# Patient Record
Sex: Male | Born: 1980 | Hispanic: Yes | Marital: Married | Smoking: Never smoker
Health system: Southern US, Community
[De-identification: ages and names within clinical notes are randomized; demographics above are authoritative.]

---

## 2004-09-25 ENCOUNTER — Emergency Department (HOSPITAL_COMMUNITY): Admission: EM | Admit: 2004-09-25 | Discharge: 2004-09-25 | Payer: Self-pay | Admitting: Emergency Medicine

## 2004-09-28 ENCOUNTER — Emergency Department (HOSPITAL_COMMUNITY): Admission: EM | Admit: 2004-09-28 | Discharge: 2004-09-28 | Payer: Self-pay | Admitting: Emergency Medicine

## 2004-09-30 ENCOUNTER — Emergency Department (HOSPITAL_COMMUNITY): Admission: EM | Admit: 2004-09-30 | Discharge: 2004-09-30 | Payer: Self-pay | Admitting: Family Medicine

## 2004-10-14 ENCOUNTER — Emergency Department (HOSPITAL_COMMUNITY): Admission: EM | Admit: 2004-10-14 | Discharge: 2004-10-14 | Payer: Self-pay | Admitting: Emergency Medicine

## 2004-12-19 ENCOUNTER — Emergency Department (HOSPITAL_COMMUNITY): Admission: EM | Admit: 2004-12-19 | Discharge: 2004-12-20 | Payer: Self-pay | Admitting: Emergency Medicine

## 2010-04-25 ENCOUNTER — Emergency Department (HOSPITAL_COMMUNITY)
Admission: EM | Admit: 2010-04-25 | Discharge: 2010-04-25 | Payer: Self-pay | Source: Home / Self Care | Admitting: Emergency Medicine

## 2010-04-26 ENCOUNTER — Emergency Department (HOSPITAL_COMMUNITY)
Admission: EM | Admit: 2010-04-26 | Discharge: 2010-04-26 | Payer: Self-pay | Source: Home / Self Care | Admitting: Emergency Medicine
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
## 2019-01-31 ENCOUNTER — Other Ambulatory Visit: Payer: Self-pay

## 2019-01-31 ENCOUNTER — Emergency Department (HOSPITAL_COMMUNITY)
Admission: EM | Admit: 2019-01-31 | Discharge: 2019-02-01 | Disposition: A | Discharge status: Home / Self Care | Payer: Self-pay | Attending: Emergency Medicine | Admitting: Emergency Medicine

## 2019-01-31 ENCOUNTER — Encounter (HOSPITAL_COMMUNITY): Payer: Self-pay | Admitting: Emergency Medicine

## 2019-01-31 DIAGNOSIS — Z20822 Contact with and (suspected) exposure to covid-19: Secondary | ICD-10-CM

## 2019-01-31 DIAGNOSIS — R1013 Epigastric pain: Secondary | ICD-10-CM

## 2019-01-31 DIAGNOSIS — U071 COVID-19: Secondary | ICD-10-CM | POA: Insufficient documentation

## 2019-01-31 DIAGNOSIS — R7401 Elevation of levels of liver transaminase levels: Secondary | ICD-10-CM | POA: Insufficient documentation

## 2019-01-31 LAB — URINALYSIS, ROUTINE W REFLEX MICROSCOPIC
Bilirubin Urine: NEGATIVE
Glucose, UA: NEGATIVE mg/dL
Hgb urine dipstick: NEGATIVE
Ketones, ur: NEGATIVE mg/dL
Leukocytes,Ua: NEGATIVE
Nitrite: NEGATIVE
Protein, ur: NEGATIVE mg/dL
Specific Gravity, Urine: 1.018 (ref 1.005–1.030)
pH: 6 (ref 5.0–8.0)

## 2019-01-31 LAB — COMPREHENSIVE METABOLIC PANEL
ALT: 178 U/L — ABNORMAL HIGH (ref 0–44)
AST: 105 U/L — ABNORMAL HIGH (ref 15–41)
Albumin: 3.7 g/dL (ref 3.5–5.0)
Alkaline Phosphatase: 102 U/L (ref 38–126)
Anion gap: 10 (ref 5–15)
BUN: 11 mg/dL (ref 6–20)
CO2: 28 mmol/L (ref 22–32)
Calcium: 8.8 mg/dL — ABNORMAL LOW (ref 8.9–10.3)
Chloride: 102 mmol/L (ref 98–111)
Creatinine, Ser: 1.1 mg/dL (ref 0.61–1.24)
GFR calc Af Amer: >60 mL/min (ref >60)
GFR calc non Af Amer: >60 mL/min (ref >60)
Glucose, Bld: 92 mg/dL (ref 70–99)
Potassium: 3.9 mmol/L (ref 3.5–5.1)
Sodium: 140 mmol/L (ref 135–145)
Total Bilirubin: 0.9 mg/dL (ref 0.3–1.2)
Total Protein: 7.1 g/dL (ref 6.5–8.1)

## 2019-01-31 LAB — CBC
HCT: 43.8 % (ref 39.0–52.0)
Hemoglobin: 14.8 g/dL (ref 13.0–17.0)
MCH: 31.8 pg (ref 26.0–34.0)
MCHC: 33.8 g/dL (ref 30.0–36.0)
MCV: 94 fL (ref 80.0–100.0)
Platelets: 237 10*3/uL (ref 150–400)
RBC: 4.66 MIL/uL (ref 4.22–5.81)
RDW: 11.1 % — ABNORMAL LOW (ref 11.5–15.5)
WBC: 6 10*3/uL (ref 4.0–10.5)
nRBC: 0 % (ref 0.0–0.2)

## 2019-01-31 LAB — LIPASE, BLOOD: Lipase: 23 U/L (ref 11–51)

## 2019-01-31 MED ORDER — SODIUM CHLORIDE 0.9% FLUSH: 3.0000 mL | Freq: Once | INTRAVENOUS | Status: DC

## 2019-01-31 NOTE — ED Triage Notes (Signed)
Pt c/o Abd pain, nausea and diarrhea for the past 4 days, pt states he is no able to smell or taste for the past 4 day neither.

## 2019-02-01 ENCOUNTER — Other Ambulatory Visit: Payer: Self-pay

## 2019-02-01 ENCOUNTER — Emergency Department (HOSPITAL_COMMUNITY): Payer: Self-pay

## 2019-02-01 MED ORDER — PANTOPRAZOLE SODIUM 40 MG PO TBEC
40.0000 mg | DELAYED_RELEASE_TABLET | Freq: Once | ORAL | Status: AC
  Administered 2019-02-01: 40 mg via ORAL
  Filled 2019-02-01: qty 1

## 2019-02-01 MED ORDER — LIDOCAINE VISCOUS HCL 2 % MT SOLN
15.0000 mL | Freq: Once | OROMUCOSAL | Status: AC
  Administered 2019-02-01: 03:00:00 15 mL via ORAL
  Filled 2019-02-01: qty 15

## 2019-02-01 MED ORDER — METOCLOPRAMIDE HCL 10 MG PO TABS: 10.0000 mg | ORAL_TABLET | Freq: Four times a day (QID) | ORAL | 0 refills | Status: AC | PRN

## 2019-02-01 MED ORDER — ALUM & MAG HYDROXIDE-SIMETH 200-200-20 MG/5ML PO SUSP
30.0000 mL | Freq: Once | ORAL | Status: AC
  Administered 2019-02-01: 30 mL via ORAL
  Filled 2019-02-01: qty 30

## 2019-02-01 MED ORDER — ONDANSETRON 4 MG PO TBDP
8.0000 mg | ORAL_TABLET | Freq: Once | ORAL | Status: AC
  Administered 2019-02-01: 8 mg via ORAL
  Filled 2019-02-01: qty 2

## 2019-02-01 NOTE — ED Notes (Signed)
Pt states improved abdominal pain rating 3 out of 10

## 2019-02-01 NOTE — ED Provider Notes (Signed)
[facility] EMERGENCY DEPARTMENT Provider Note   CSN: [id number] Arrival date & time: 01/31/19  2039    History   Chief Complaint Chief Complaint  Patient presents with  . Abdominal Pain    HPI Kenneth Acosta is a 38 y.o. male.   The history is provided by the patient.  Abdominal Pain He has been sick for the last 2 weeks with subjective fever as well as chills and sweats.  There has been a cough which is productive of small amount of clear sputum.  He has had some shortness of breath.  Over the last week, he has developed epigastric pain with nausea but no vomiting.  He did have diarrhea starting about 5 days ago, but that has since resolved.  Epigastric pain is worse with eating.  Also, he has lost a sense of smell and taste over the last week.  He tried taking acetaminophen, ibuprofen without relief.  He also complains of generalized body aches.  He denies tobacco and ethanol use.  There has been no known exposure to COVID-19.  History reviewed. No pertinent past medical history.  There are no active problems to display for this patient.   History reviewed. No pertinent surgical history.      Home Medications    Prior to Admission medications   Not on File    Family History History reviewed. No pertinent family history.  Social History Social History   Tobacco Use  . Smoking status: Never Smoker  Substance Use Topics  . Alcohol use: Never    Frequency: Never  . Drug use: Never     Allergies   Patient has no known allergies.   Review of Systems Review of Systems  Gastrointestinal: Positive for abdominal pain.  All other systems reviewed and are negative.    Physical Exam Updated Vital Signs BP 104/62 (BP Location: Left Arm)   Pulse 75   Temp 98.2 F (36.8 C) (Oral)   Resp 20   Ht 5\' 8"  (1.727 m)   Wt 99.8 kg   SpO2 99%   BMI 33.45 kg/m   Physical Exam Vitals signs and nursing note reviewed.    38 year old male,  resting comfortably and in no acute distress. Vital signs are significant for borderline elevated heart rate. Oxygen saturation is 97%, which is normal. Head is normocephalic and atraumatic. PERRLA, EOMI. Oropharynx is clear. Neck is nontender and supple without adenopathy or JVD. Back is nontender and there is no CVA tenderness. Lungs are clear without rales, wheezes, or rhonchi. Chest is nontender. Heart has regular rate and rhythm without murmur. Abdomen is soft, flat, with mild epigastric tenderness.  There is no rebound or guarding.  There are no masses or hepatosplenomegaly and peristalsis is normoactive. Extremities have no cyanosis or edema, full range of motion is present. Skin is warm and dry without rash. Neurologic: Mental status is normal, cranial nerves are intact, there are no motor or sensory deficits.  ED Treatments / Results  Labs (all labs ordered are listed, but only abnormal results are displayed) Labs Reviewed  COMPREHENSIVE METABOLIC PANEL - Abnormal; Notable for the following components:      Result Value   Calcium 8.8 (*)    AST 105 (*)    ALT 178 (*)    All other components within normal limits  CBC - Abnormal; Notable for the following components:   RDW 11.1 (*)    All other components within normal limits  NOVEL  CORONAVIRUS, NAA (HOSP ORDER, SEND-OUT TO REF LAB; TAT 18-24 HRS)  LIPASE, BLOOD  URINALYSIS, ROUTINE W REFLEX MICROSCOPIC   Radiology Dg Chest Port 1 View  Result Date: 02/01/2019 CLINICAL DATA:  Cough and fever EXAM: PORTABLE CHEST 1 VIEW COMPARISON:  None. FINDINGS: The heart size and mediastinal contours are within normal limits. Both lungs are clear. The visualized skeletal structures are unremarkable. IMPRESSION: Clear lungs Electronically Signed   By: Deatra Robinson M.D.   On: 02/01/2019 02:54    Procedures Procedures  Medications Ordered in ED Medications  sodium chloride flush (NS) 0.9 % injection 3 mL (3 mLs Intravenous Not Given  02/01/19 0155)  ondansetron (ZOFRAN-ODT) disintegrating tablet 8 mg (8 mg Oral Given 02/01/19 0234)  alum & mag hydroxide-simeth (MAALOX/MYLANTA) 200-200-20 MG/5ML suspension 30 mL (30 mLs Oral Given 02/01/19 0234)    And  lidocaine (XYLOCAINE) 2 % viscous mouth solution 15 mL (15 mLs Oral Given 02/01/19 0234)  pantoprazole (PROTONIX) EC tablet 40 mg (40 mg Oral Given 02/01/19 0234)     Initial Impression / Assessment and Plan / ED Course  I have reviewed the triage vital signs and the nursing notes.  Pertinent labs & imaging results that were available during my care of the patient were reviewed by me and considered in my medical decision making (see chart for details).  Symptom complex worrisome for COVID-19.  Epigastric pain may be gastritis, peptic ulcer, GERD.  No red flags to suggest more serious pathology.  Screening labs are significant for mild elevation of transaminases which is nonspecific, but consistent with COVID-19.  Will check chest x-ray and will give therapeutic trial of ondansetron, GI cocktail, pantoprazole.  Old records are reviewed, and there are no relevant past visits.  Will send nasal swab for COVID-19.  Chest x-ray is unremarkable.  He had excellent relief of symptoms with above-noted treatment.  He is discharged with prescription for metoclopramide for nausea, advised use over-the-counter omeprazole and antacids as needed.  Return precautions discussed.  Kenneth Acosta was evaluated in Emergency Department on 02/01/2019 for the symptoms described in the history of present illness. He was evaluated in the context of the global COVID-19 pandemic, which necessitated consideration that the patient might be at risk for infection with the SARS-CoV-2 virus that causes COVID-19. Institutional protocols and algorithms that pertain to the evaluation of patients at risk for COVID-19 are in a state of rapid change based on information released by regulatory bodies including the CDC  and federal and state organizations. These policies and algorithms were followed during the patient's care in the ED.  Final Clinical Impressions(s) / ED Diagnoses   Final diagnoses:  Epigastric pain  Suspected COVID-19 virus infection  Elevated transaminase level    ED Discharge Orders         Ordered    metoCLOPramide (REGLAN) 10 MG tablet  Every 6 hours PRN     02/01/19 0317           Dione Booze, MD 02/01/19 0320

## 2019-02-01 NOTE — ED Notes (Signed)
Discharge instructions including medications and covid precautions/results discussed with pt. Pt verbalized understanding with no questions at this time

## 2019-02-01 NOTE — ED Notes (Signed)
X-ray at bedside

## 2019-02-01 NOTE — Discharge Instructions (Addendum)
Take omeprazole (Prilosec OTC) once a day.  Take antacids like Pepto Bismol, Maalox, Tums as needed.  Your Covid 19 test will be available in about two days. You should be able to see it on My Chart.  Return if you start getting short of breath, or have any other concerns.

## 2019-02-04 LAB — NOVEL CORONAVIRUS, NAA (HOSP ORDER, SEND-OUT TO REF LAB; TAT 18-24 HRS): SARS-CoV-2, NAA: DETECTED — AB

## 2021-04-24 IMAGING — DX DG CHEST 1V PORT
1 series · 1 of 1 positions shown · non-contrast
Comparison: None.

CLINICAL DATA: Cough and fever

EXAM:
PORTABLE CHEST 1 VIEW

[chest ap]
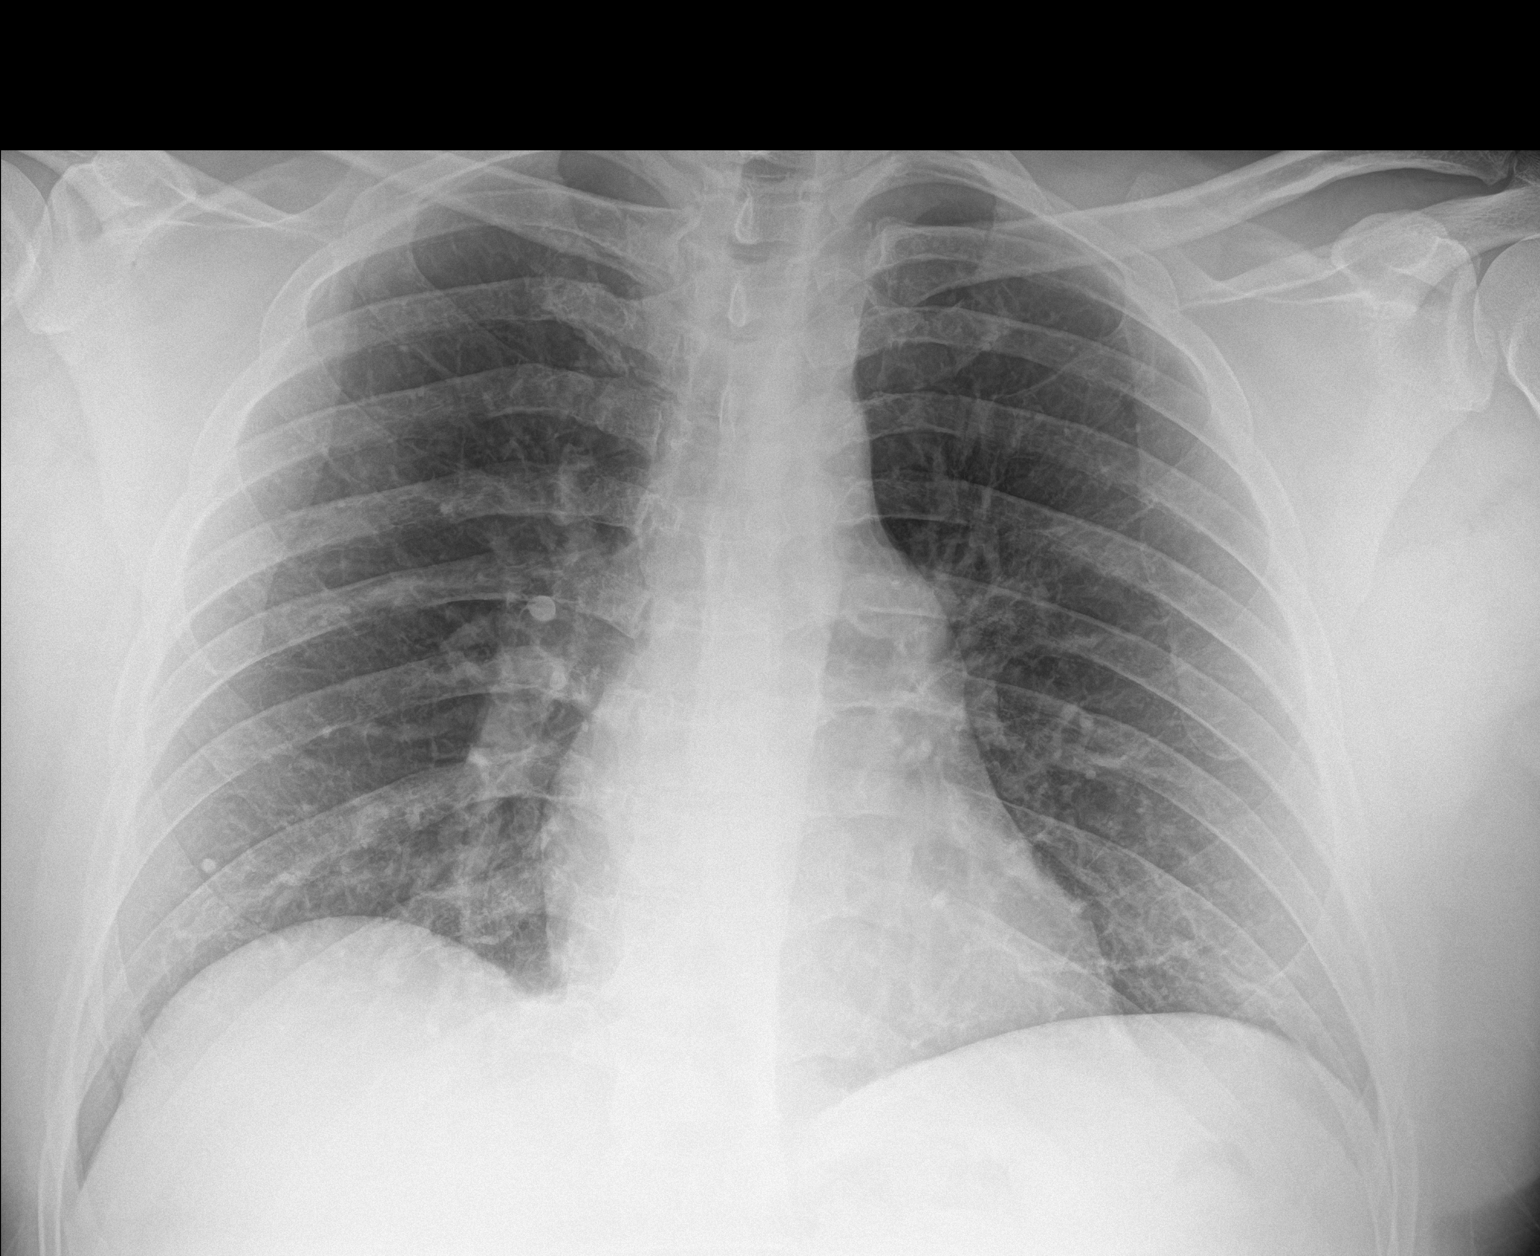

[1 of 1 positions shown; findings below may reference images not displayed]

FINDINGS: The heart size and mediastinal contours are within normal limits.
Both lungs are clear. The visualized skeletal structures are
unremarkable.
IMPRESSION: Clear lungs

## 2022-09-24 ENCOUNTER — Emergency Department (HOSPITAL_COMMUNITY)
Admission: EM | Admit: 2022-09-24 | Discharge: 2022-09-25 | Disposition: A | Discharge status: Home / Self Care | Payer: Self-pay | Attending: Emergency Medicine | Admitting: Emergency Medicine

## 2022-09-24 DIAGNOSIS — N451 Epididymitis: Secondary | ICD-10-CM | POA: Insufficient documentation

## 2022-09-25 ENCOUNTER — Other Ambulatory Visit: Payer: Self-pay

## 2022-09-25 ENCOUNTER — Emergency Department (HOSPITAL_COMMUNITY): Payer: Self-pay

## 2022-09-25 ENCOUNTER — Encounter (HOSPITAL_COMMUNITY): Payer: Self-pay

## 2022-09-25 LAB — URINALYSIS, ROUTINE W REFLEX MICROSCOPIC
Bacteria, UA: NONE SEEN
Bilirubin Urine: NEGATIVE
Glucose, UA: NEGATIVE mg/dL
Hgb urine dipstick: NEGATIVE
Ketones, ur: NEGATIVE mg/dL
Leukocytes,Ua: NEGATIVE
Nitrite: POSITIVE — AB
Protein, ur: NEGATIVE mg/dL
Specific Gravity, Urine: 1.021 (ref 1.005–1.030)
pH: 6 (ref 5.0–8.0)

## 2022-09-25 LAB — COMPREHENSIVE METABOLIC PANEL
ALT: 35 U/L (ref 0–44)
AST: 26 U/L (ref 15–41)
Albumin: 3.7 g/dL (ref 3.5–5.0)
Alkaline Phosphatase: 70 U/L (ref 38–126)
Anion gap: 14 (ref 5–15)
BUN: 13 mg/dL (ref 6–20)
CO2: 21 mmol/L — ABNORMAL LOW (ref 22–32)
Calcium: 9.1 mg/dL (ref 8.9–10.3)
Chloride: 101 mmol/L (ref 98–111)
Creatinine, Ser: 1.13 mg/dL (ref 0.61–1.24)
GFR, Estimated: >60 mL/min (ref >60)
Glucose, Bld: 117 mg/dL — ABNORMAL HIGH (ref 70–99)
Potassium: 3.7 mmol/L (ref 3.5–5.1)
Sodium: 136 mmol/L (ref 135–145)
Total Bilirubin: 2 mg/dL — ABNORMAL HIGH (ref 0.3–1.2)
Total Protein: 7.2 g/dL (ref 6.5–8.1)

## 2022-09-25 LAB — GC/CHLAMYDIA PROBE AMP (~~LOC~~) NOT AT ARMC
Chlamydia: NEGATIVE
Comment: NEGATIVE
Comment: NORMAL
Neisseria Gonorrhea: NEGATIVE

## 2022-09-25 LAB — CBC WITH DIFFERENTIAL/PLATELET
Abs Immature Granulocytes: 0.06 10*3/uL (ref 0.00–0.07)
Basophils Absolute: 0.1 10*3/uL (ref 0.0–0.1)
Basophils Relative: 0 %
Eosinophils Absolute: 0.3 10*3/uL (ref 0.0–0.5)
Eosinophils Relative: 2 %
HCT: 42.5 % (ref 39.0–52.0)
Hemoglobin: 14 g/dL (ref 13.0–17.0)
Immature Granulocytes: 0 %
Lymphocytes Relative: 14 %
Lymphs Abs: 2.1 10*3/uL (ref 0.7–4.0)
MCH: 30.4 pg (ref 26.0–34.0)
MCHC: 32.9 g/dL (ref 30.0–36.0)
MCV: 92.4 fL (ref 80.0–100.0)
Monocytes Absolute: 0.9 10*3/uL (ref 0.1–1.0)
Monocytes Relative: 5 %
Neutro Abs: 12.4 10*3/uL — ABNORMAL HIGH (ref 1.7–7.7)
Neutrophils Relative %: 79 %
Platelets: 275 10*3/uL (ref 150–400)
RBC: 4.6 MIL/uL (ref 4.22–5.81)
RDW: 11.4 % — ABNORMAL LOW (ref 11.5–15.5)
WBC: 15.8 10*3/uL — ABNORMAL HIGH (ref 4.0–10.5)
nRBC: 0 % (ref 0.0–0.2)

## 2022-09-25 MED ORDER — CEFTRIAXONE SODIUM 500 MG IJ SOLR
500.0000 mg | Freq: Once | INTRAMUSCULAR | Status: AC
  Administered 2022-09-25: 500 mg via INTRAMUSCULAR
  Filled 2022-09-25: qty 500

## 2022-09-25 MED ORDER — LEVOFLOXACIN 500 MG PO TABS: 500.0000 mg | ORAL_TABLET | Freq: Every day | ORAL | 0 refills | Status: AC

## 2022-09-25 MED ORDER — OXYCODONE-ACETAMINOPHEN 5-325 MG PO TABS
1.0000 | ORAL_TABLET | Freq: Once | ORAL | Status: AC
  Administered 2022-09-25: 1 via ORAL
  Filled 2022-09-25: qty 1

## 2022-09-25 MED ORDER — ONDANSETRON 4 MG PO TBDP
8.0000 mg | ORAL_TABLET | Freq: Once | ORAL | Status: AC
  Administered 2022-09-25: 8 mg via ORAL
  Filled 2022-09-25: qty 2

## 2022-09-25 MED ORDER — LEVOFLOXACIN 500 MG PO TABS: 500.0000 mg | ORAL_TABLET | Freq: Every day | ORAL | 0 refills | Status: DC

## 2022-09-25 MED ORDER — OXYCODONE HCL 5 MG PO TABS
5.0000 mg | ORAL_TABLET | Freq: Once | ORAL | Status: AC
  Administered 2022-09-25: 5 mg via ORAL
  Filled 2022-09-25: qty 1

## 2022-09-25 MED ORDER — KETOROLAC TROMETHAMINE 60 MG/2ML IM SOLN
60.0000 mg | Freq: Once | INTRAMUSCULAR | Status: AC
  Administered 2022-09-25: 60 mg via INTRAMUSCULAR
  Filled 2022-09-25: qty 2

## 2022-09-25 MED ORDER — LIDOCAINE HCL (PF) 1 % IJ SOLN
1.0000 mL | Freq: Once | INTRAMUSCULAR | Status: AC
  Administered 2022-09-25: 2.1 mL
  Filled 2022-09-25: qty 5

## 2022-09-25 MED ORDER — KETOROLAC TROMETHAMINE 30 MG/ML IJ SOLN
30.0000 mg | Freq: Once | INTRAMUSCULAR | Status: DC
  Filled 2022-09-25: qty 1

## 2022-09-25 NOTE — ED Provider Notes (Signed)
EMERGENCY DEPARTMENT AT [facility] Provider Note   CSN: [id number] Arrival date & time: 09/24/22  2349     History  Chief Complaint  Patient presents with   Testicle Pain    Kenneth Acosta is a 42 y.o. male.  HPI Patient without significant medical history presenting with complaints of testicular pain started out 2 hours prior to arrival, states his left testicle hurts a lot, states that pain is feeling rating to his left flank, he states he has dysuria but denies any hematuria he does admit to penile discharge.  No trauma to the area.  He denies any suprapubic pain fevers chills cough congestion no history of kidney stones, states that he is sexually active, monogamous relationship, he has never had this in the past.  Denies any peritoneal pain or rectal pain.  Home Medications Prior to Admission medications   Medication Sig Start Date End Date Taking? Authorizing Provider  levofloxacin (LEVAQUIN) 500 MG tablet Take 1 tablet (500 mg total) by mouth daily for 7 days. 09/25/22 10/02/22 Yes Carroll Sage, PA-C  metoCLOPramide (REGLAN) 10 MG tablet Take 1 tablet (10 mg total) by mouth every 6 (six) hours as needed for nausea (or headache). 02/01/19   Dione Booze, MD      Allergies    Patient has no known allergies.    Review of Systems   Review of Systems  Constitutional:  Negative for chills and fever.  Respiratory:  Negative for shortness of breath.   Cardiovascular:  Negative for chest pain.  Gastrointestinal:  Negative for abdominal pain.  Genitourinary:  Positive for dysuria and testicular pain.  Neurological:  Negative for headaches.    Physical Exam Updated Vital Signs BP 125/71 (BP Location: Right Arm)   Pulse (!) 113   Temp 98.5 F (36.9 C)   Resp 17   Ht 5\' 8"  (1.727 m)   Wt 99.8 kg   SpO2 95%   BMI 33.45 kg/m  Physical Exam Vitals and nursing note reviewed. Exam conducted with a chaperone present.  Constitutional:      General: He  is not in acute distress.    Appearance: He is not ill-appearing.  HENT:     Head: Normocephalic and atraumatic.     Nose: No congestion.  Eyes:     Conjunctiva/sclera: Conjunctivae normal.  Cardiovascular:     Rate and Rhythm: Normal rate and regular rhythm.     Pulses: Normal pulses.     Heart sounds: No murmur heard.    No friction rub. No gallop.  Pulmonary:     Effort: No respiratory distress.     Breath sounds: No wheezing, rhonchi or rales.  Abdominal:     Palpations: Abdomen is soft.     Tenderness: There is no abdominal tenderness. There is no right CVA tenderness or left CVA tenderness.  Genitourinary:    Comments: Chaperone present genital exam was performed patient is uncircumcised with 2 descended testicles no skin lesions or ulcers present.  The left testicle is enlarged and lower than the right 1, there is no Bell Clapper deformity, cremaster reflex was not intact, no palpable inguinal hernia no tenderness within the peritoneum.  Left testicle was tender to palpation. Skin:    General: Skin is warm and dry.  Neurological:     Mental Status: He is alert.  Psychiatric:        Mood and Affect: Mood normal.     ED Results / Procedures /  Treatments   Labs (all labs ordered are listed, but only abnormal results are displayed) Labs Reviewed  COMPREHENSIVE METABOLIC PANEL - Abnormal; Notable for the following components:      Result Value   CO2 21 (*)    Glucose, Bld 117 (*)    Total Bilirubin 2.0 (*)    All other components within normal limits  CBC WITH DIFFERENTIAL/PLATELET - Abnormal; Notable for the following components:   WBC 15.8 (*)    RDW 11.4 (*)    Neutro Abs 12.4 (*)    All other components within normal limits  URINALYSIS, ROUTINE W REFLEX MICROSCOPIC - Abnormal; Notable for the following components:   Color, Urine AMBER (*)    Nitrite POSITIVE (*)    All other components within normal limits  URINE CULTURE  GC/CHLAMYDIA PROBE AMP ([place]) NOT  AT [facility]    EKG None  Radiology US SCROTUM W/DOPPLER  Result Date: 09/25/2022 CLINICAL DATA:  Testicular pain. EXAM: SCROTAL ULTRASOUND DOPPLER ULTRASOUND OF THE TESTICLES TECHNIQUE: Complete ultrasound examination of the testicles, epididymis, and other scrotal structures was performed. Color and spectral Doppler ultrasound were also utilized to evaluate blood flow to the testicles. COMPARISON:  None Available. FINDINGS: Right testicle Measurements: 5.0 cm x 2.6 cm x 3.0 cm. No mass or microlithiasis visualized. Left testicle Measurements: 4.6 cm x 3.1 cm x 3.3 cm. No mass or microlithiasis visualized. Right epididymis: A 4 mm cyst is seen within the head of the right epididymis. Left epididymis: The left epididymis is enlarged and demonstrates increased vascularity on color Doppler evaluation. Hydrocele:  There is a small to moderate sized left-sided hydrocele. Varicocele:  None visualized. Pulsed Doppler interrogation of both testes demonstrates normal low resistance arterial and venous waveforms bilaterally. IMPRESSION: 1. Findings consistent with left-sided epididymitis. 2. Small to moderate sized left-sided hydrocele. 3. Cm right epididymal head cyst. Electronically Signed   By: Aram Candela M.D.   On: 09/25/2022 01:39    Procedures Procedures    Medications Ordered in ED Medications  ketorolac (TORADOL) 30 MG/ML injection 30 mg (30 mg Intravenous Not Given 09/25/22 0319)  oxyCODONE-acetaminophen (PERCOCET/ROXICET) 5-325 MG per tablet 1 tablet (1 tablet Oral Given 09/25/22 0027)  ondansetron (ZOFRAN-ODT) disintegrating tablet 8 mg (8 mg Oral Given 09/25/22 0027)  cefTRIAXone (ROCEPHIN) injection 500 mg (500 mg Intramuscular Given 09/25/22 0306)  lidocaine (PF) (XYLOCAINE) 1 % injection 1-2.1 mL (2.1 mLs Other Given 09/25/22 0306)  oxyCODONE (Oxy IR/ROXICODONE) immediate release tablet 5 mg (5 mg Oral Given 09/25/22 0316)  ketorolac (TORADOL) injection 60 mg (60 mg Intramuscular Given 09/25/22 0327)     ED Course/ Medical Decision Making/ A&P                             Medical Decision Making Amount and/or Complexity of Data Reviewed Labs: ordered. Radiology: ordered.  Risk Prescription drug management.   This patient presents to the ED for concern of testicular pain, this involves an extensive number of treatment options, and is a complaint that carries with it a high risk of complications and morbidity.  The differential diagnosis includes testicular torsion, epididymitis, prostatitis    Additional history obtained:  Additional history obtained from partner at bedside External records from outside source obtained and reviewed including N/A   Co morbidities that complicate the patient evaluation  N/A  Social Determinants of Health:  No primary care provider    Lab Tests:  I Ordered, and  personally interpreted labs.  The pertinent results include: CBC shows leukocytosis 15.8, CMP reveals CO2 of 21 glucose 117, UA shows positive nitrates GC chlamydia pending   Imaging Studies ordered:  I ordered imaging studies including scrotum ultrasound I independently visualized and interpreted imaging which showed hydrocele with epididymitis I agree with the radiologist interpretation   Cardiac Monitoring:  The patient was maintained on a cardiac monitor.  I personally viewed and interpreted the cardiac monitored which showed an underlying rhythm of: N/A   Medicines ordered and prescription drug management:  I ordered medication including pain medication, antibiotics I have reviewed the patients home medicines and have made adjustments as needed  Critical Interventions:  N/A   Reevaluation:  Presents with testicular pain, concern for possible testicular torsion will obtain ultrasound for further evaluation  Ultrasound is negative for torsion but shows epididymitis, will start him on antibiotics, culture urine, follow-up with urology patient agreement this  plan.  Consultations Obtained:  N/A    Test Considered:  N/A    Rule out Suspicion for torsion is low at this time ultrasound is negative for these findings.  I doubt prostatitis is no tenderness behind the peritoneum, not patient denies rectal pain.  I doubt pyelo or kidney stone presentation atypical, he had no CVA tenderness on my exam, no flank tenderness no suprapubic pain, no noted hematuria noted on UA.  Suspicion for inguinal hernia is low at this time there is no palpable mass present on exam.    Dispostion and problem list  After consideration of the diagnostic results and the patients response to treatment, I feel that the patent would benefit from discharge.  Epididymitis-1 dose of ceftriaxone, will discharge home on Levaquin, will recommend scrotal support, follow-up with urology for further evaluation and strict return precautions.            Final Clinical Impression(s) / ED Diagnoses Final diagnoses:  Epididymitis    Rx / DC Orders ED Discharge Orders          Ordered    levofloxacin (LEVAQUIN) 500 MG tablet  Daily        09/25/22 0326              Carroll Sage, PA-C 09/25/22 0328    Gilda Crease, MD 09/25/22 [phone]

## 2022-09-25 NOTE — Discharge Instructions (Signed)
You have an infection of your left testicle I have started you on antibiotics please take as prescribed.  I recommend scrotal support please wear underwear that cradle your testicles this will help decrease your pain.  Please take ibuprofen 400 mg 3 times for 7 days this will help with your pain  Please follow-up with [facility]  Come back to the emergency department if you develop chest pain, shortness of breath, severe abdominal pain, uncontrolled nausea, vomiting, diarrhea.

## 2022-09-25 NOTE — ED Triage Notes (Signed)
2 days ago pt began having left testicular pain and now pain is 10/10 and traveling down leg. During triage pt can not sit still from pain. Denies injury to testicular. Pt states the pain started  when he was getting out of the car

## 2022-09-25 NOTE — ED Provider Triage Note (Signed)
Emergency Medicine Provider Triage Evaluation Note  Kenneth Acosta , a 42 y.o. male  was evaluated in triage.  Pt complains of right-sided testicular pain, started out 2 hours ago, has gone larger and more swollen, states that 2 days ago he was having dysuria, states that he took some medicine for a UTI but has not gotten any better, states to have pain in his left back, states that he is never had this with before..  Review of Systems  Positive: Testicular pain, left-sided flank pain Negative: Nausea vomiting  Physical Exam  BP 125/71 (BP Location: Right Arm)   Pulse (!) 113   Temp 98.5 F (36.9 C)   Resp 17   Ht 5\' 8"  (1.727 m)   Wt 99.8 kg   SpO2 95%   BMI 33.45 kg/m  Gen:   Awake, no distress   Resp:  Normal effort  MSK:   Moves extremities without difficulty  Other:  With chaperone present genital exam was performed uncircumcised with 2 descended testicles, left is larger than the right, extremely tender to palpation, negative cremaster reflex, no inguinal hernia noted, no general lesions present  Medical Decision Making  Medically screening exam initiated at 12:26 AM.  Appropriate orders placed.  Lynkin B Potempa was informed that the remainder of the evaluation will be completed by another provider, this initial triage assessment does not replace that evaluation, and the importance of remaining in the ED until their evaluation is complete.  Concern for testicular torsion, will order ultrasound and further lab workup, will call ultrasound to ensure that imaging is performed.   Carroll Sage, PA-C 09/25/22 [phone]

## 2022-09-26 LAB — URINE CULTURE: Culture: NO GROWTH

## 2023-12-31 NOTE — Progress Notes (Unsigned)
    Chief Complaint: No chief complaint on file.   History of Present Illness:  Kenneth Acosta is a 43 y.o. male who is seen in consultation from Patient, No Pcp Per for evaluation of ***.   Past Medical History:  No past medical history on file.  Past Surgical History:  No past surgical history on file.  Allergies:  No Known Allergies  Family History:  No family history on file.  Social History:  Social History   Tobacco Use   Smoking status: Never  Substance Use Topics   Alcohol use: Never   Drug use: Never    Review of symptoms:  Constitutional:  Negative for unexplained weight loss, night sweats, fever, chills ENT:  Negative for nose bleeds, sinus pain, painful swallowing CV:  Negative for chest pain, shortness of breath, exercise intolerance, palpitations, loss of consciousness Resp:  Negative for cough, wheezing, shortness of breath GI:  Negative for nausea, vomiting, diarrhea, bloody stools GU:  Positives noted in HPI; otherwise negative for gross hematuria, dysuria, urinary incontinence Neuro:  Negative for seizures, poor balance, limb weakness, slurred speech Psych:  Negative for lack of energy, depression, anxiety Endocrine:  Negative for polydipsia, polyuria, symptoms of hypoglycemia (dizziness, hunger, sweating) Hematologic:  Negative for anemia, purpura, petechia, prolonged or excessive bleeding, use of anticoagulants  Allergic:  Negative for difficulty breathing or choking as a result of exposure to anything; no shellfish allergy; no allergic response (rash/itch) to materials, foods  Physical exam: There were no vitals taken for this visit. GENERAL APPEARANCE:  Well appearing, well developed, well nourished, NAD HEENT: Atraumatic, Normocephalic. NECK: Normal appearance LUNGS: Normal inspiratory and expiratory excursion HEART: Regular Rate ABDOMEN: ***. GU: Phallus normal, no lesions. Scrotal skin normal. Testicles/epididymal structures normal. Meatus  normal. Normal anal sphincter tone, prostate ***mL, symmetric, non nodular, non tender. EXTREMITIES: Moves all extremities well.  Without clubbing, cyanosis, or edema. NEUROLOGIC:  Alert and oriented x 3, normal gait, CN II-XII grossly intact.  MENTAL STATUS:  Appropriate. SKIN:  Warm, dry and intact.    Results: No results found for this or any previous visit (from the past 24 hours).  I have reviewed referring/prior physicians notes  I have reviewed urinalysis  I have reviewed PSA results  I have reviewed prior imaging  I have reviewed urine culture results  Assessment: ***   Plan: ***

## 2024-01-01 ENCOUNTER — Ambulatory Visit (INDEPENDENT_AMBULATORY_CARE_PROVIDER_SITE_OTHER): Admitting: Urology

## 2024-01-01 VITALS — BP 111/69 | HR 74 | Ht 70.0 in | Wt 220.0 lb

## 2024-01-01 DIAGNOSIS — R1032 Left lower quadrant pain: Secondary | ICD-10-CM

## 2024-01-01 DIAGNOSIS — R3914 Feeling of incomplete bladder emptying: Secondary | ICD-10-CM

## 2024-01-01 DIAGNOSIS — R350 Frequency of micturition: Secondary | ICD-10-CM

## 2024-01-01 DIAGNOSIS — N453 Epididymo-orchitis: Secondary | ICD-10-CM

## 2024-01-01 LAB — URINALYSIS, ROUTINE W REFLEX MICROSCOPIC
Bilirubin, UA: NEGATIVE
Glucose, UA: NEGATIVE
Ketones, UA: NEGATIVE
Leukocytes,UA: NEGATIVE
Nitrite, UA: NEGATIVE
Protein,UA: NEGATIVE
RBC, UA: NEGATIVE
Specific Gravity, UA: 1.025 (ref 1.005–1.030)
Urobilinogen, Ur: 0.2 mg/dL (ref 0.2–1.0)
pH, UA: 6 (ref 5.0–7.5)

## 2024-01-01 LAB — BLADDER SCAN AMB NON-IMAGING: Scan Result: 20

## 2024-01-07 ENCOUNTER — Ambulatory Visit (HOSPITAL_BASED_OUTPATIENT_CLINIC_OR_DEPARTMENT_OTHER): Admission: RE | Admit: 2024-01-07 | Source: Ambulatory Visit
